# Patient Record
Sex: Female | Born: 1945 | Race: White | Hispanic: No | Marital: Married | Smoking: Never smoker
Health system: Southern US, Community
[De-identification: ages and names within clinical notes are randomized; demographics above are authoritative.]

## PROBLEM LIST (undated history)

## (undated) DIAGNOSIS — O00109 Unspecified tubal pregnancy without intrauterine pregnancy: Secondary | ICD-10-CM

## (undated) HISTORY — DX: Unspecified tubal pregnancy without intrauterine pregnancy: O00.109

## (undated) HISTORY — PX: ABDOMINAL HYSTERECTOMY: SHX81

## (undated) HISTORY — PX: BREAST LUMPECTOMY: SHX2

---

## 2007-06-12 ENCOUNTER — Ambulatory Visit: Payer: Self-pay | Admitting: Internal Medicine

## 2007-07-10 ENCOUNTER — Ambulatory Visit: Payer: Self-pay | Admitting: Internal Medicine

## 2015-08-17 ENCOUNTER — Other Ambulatory Visit (HOSPITAL_BASED_OUTPATIENT_CLINIC_OR_DEPARTMENT_OTHER): Payer: Self-pay | Admitting: Internal Medicine

## 2015-08-17 DIAGNOSIS — Z1231 Encounter for screening mammogram for malignant neoplasm of breast: Secondary | ICD-10-CM

## 2015-09-04 ENCOUNTER — Ambulatory Visit (HOSPITAL_BASED_OUTPATIENT_CLINIC_OR_DEPARTMENT_OTHER)
Admission: RE | Admit: 2015-09-04 | Discharge: 2015-09-04 | Disposition: A | Discharge status: Home / Self Care | Payer: Medicare Other | Source: Ambulatory Visit | Attending: Internal Medicine | Admitting: Internal Medicine

## 2015-09-04 DIAGNOSIS — Z1231 Encounter for screening mammogram for malignant neoplasm of breast: Secondary | ICD-10-CM | POA: Diagnosis not present

## 2016-07-31 ENCOUNTER — Other Ambulatory Visit (HOSPITAL_BASED_OUTPATIENT_CLINIC_OR_DEPARTMENT_OTHER): Payer: Self-pay | Admitting: Internal Medicine

## 2016-07-31 DIAGNOSIS — Z1231 Encounter for screening mammogram for malignant neoplasm of breast: Secondary | ICD-10-CM

## 2016-09-05 ENCOUNTER — Ambulatory Visit (HOSPITAL_BASED_OUTPATIENT_CLINIC_OR_DEPARTMENT_OTHER)
Admission: RE | Admit: 2016-09-05 | Discharge: 2016-09-05 | Disposition: A | Discharge status: Home / Self Care | Payer: Medicare Other | Source: Ambulatory Visit | Attending: Internal Medicine | Admitting: Internal Medicine

## 2016-09-05 DIAGNOSIS — Z1231 Encounter for screening mammogram for malignant neoplasm of breast: Secondary | ICD-10-CM | POA: Diagnosis present

## 2017-08-04 ENCOUNTER — Encounter: Payer: Self-pay | Admitting: Gastroenterology

## 2017-08-06 ENCOUNTER — Other Ambulatory Visit (HOSPITAL_BASED_OUTPATIENT_CLINIC_OR_DEPARTMENT_OTHER): Payer: Self-pay | Admitting: Internal Medicine

## 2017-08-06 DIAGNOSIS — Z1231 Encounter for screening mammogram for malignant neoplasm of breast: Secondary | ICD-10-CM

## 2017-09-15 ENCOUNTER — Inpatient Hospital Stay (HOSPITAL_BASED_OUTPATIENT_CLINIC_OR_DEPARTMENT_OTHER): Admission: RE | Admit: 2017-09-15 | Payer: Medicare Other | Source: Ambulatory Visit

## 2017-10-03 ENCOUNTER — Ambulatory Visit (HOSPITAL_BASED_OUTPATIENT_CLINIC_OR_DEPARTMENT_OTHER)
Admission: RE | Admit: 2017-10-03 | Discharge: 2017-10-03 | Disposition: A | Discharge status: Home / Self Care | Payer: Medicare Other | Source: Ambulatory Visit | Attending: Internal Medicine | Admitting: Internal Medicine

## 2017-10-03 DIAGNOSIS — Z1231 Encounter for screening mammogram for malignant neoplasm of breast: Secondary | ICD-10-CM | POA: Insufficient documentation

## 2018-09-18 ENCOUNTER — Other Ambulatory Visit (HOSPITAL_BASED_OUTPATIENT_CLINIC_OR_DEPARTMENT_OTHER): Payer: Self-pay | Admitting: Physician Assistant

## 2018-09-18 DIAGNOSIS — Z1231 Encounter for screening mammogram for malignant neoplasm of breast: Secondary | ICD-10-CM

## 2018-10-09 ENCOUNTER — Ambulatory Visit (HOSPITAL_BASED_OUTPATIENT_CLINIC_OR_DEPARTMENT_OTHER)
Admission: RE | Admit: 2018-10-09 | Discharge: 2018-10-09 | Disposition: A | Discharge status: Home / Self Care | Payer: Medicare Other | Source: Ambulatory Visit | Attending: Physician Assistant | Admitting: Physician Assistant

## 2018-10-09 DIAGNOSIS — Z1231 Encounter for screening mammogram for malignant neoplasm of breast: Secondary | ICD-10-CM | POA: Insufficient documentation

## 2019-09-07 ENCOUNTER — Other Ambulatory Visit (HOSPITAL_BASED_OUTPATIENT_CLINIC_OR_DEPARTMENT_OTHER): Payer: Self-pay | Admitting: Internal Medicine

## 2019-09-07 DIAGNOSIS — Z1231 Encounter for screening mammogram for malignant neoplasm of breast: Secondary | ICD-10-CM

## 2019-10-12 ENCOUNTER — Encounter (HOSPITAL_BASED_OUTPATIENT_CLINIC_OR_DEPARTMENT_OTHER): Payer: Self-pay

## 2019-10-12 ENCOUNTER — Other Ambulatory Visit (HOSPITAL_BASED_OUTPATIENT_CLINIC_OR_DEPARTMENT_OTHER): Payer: Self-pay | Admitting: Physician Assistant

## 2019-10-12 ENCOUNTER — Other Ambulatory Visit (HOSPITAL_BASED_OUTPATIENT_CLINIC_OR_DEPARTMENT_OTHER): Payer: Self-pay | Admitting: Obstetrics and Gynecology

## 2019-10-12 ENCOUNTER — Ambulatory Visit (HOSPITAL_BASED_OUTPATIENT_CLINIC_OR_DEPARTMENT_OTHER)
Admission: RE | Admit: 2019-10-12 | Discharge: 2019-10-12 | Disposition: A | Discharge status: Home / Self Care | Payer: Medicare PPO | Source: Ambulatory Visit | Attending: Internal Medicine | Admitting: Internal Medicine

## 2019-10-12 ENCOUNTER — Other Ambulatory Visit: Payer: Self-pay

## 2019-10-12 DIAGNOSIS — Z1231 Encounter for screening mammogram for malignant neoplasm of breast: Secondary | ICD-10-CM

## 2019-10-13 ENCOUNTER — Other Ambulatory Visit: Payer: Self-pay | Admitting: Physician Assistant

## 2019-10-13 DIAGNOSIS — R928 Other abnormal and inconclusive findings on diagnostic imaging of breast: Secondary | ICD-10-CM

## 2019-10-19 ENCOUNTER — Other Ambulatory Visit: Payer: Self-pay

## 2019-10-19 ENCOUNTER — Ambulatory Visit
Admission: RE | Admit: 2019-10-19 | Discharge: 2019-10-19 | Disposition: A | Discharge status: Home / Self Care | Payer: Medicare PPO | Source: Ambulatory Visit | Attending: Physician Assistant | Admitting: Physician Assistant

## 2019-10-19 DIAGNOSIS — R928 Other abnormal and inconclusive findings on diagnostic imaging of breast: Secondary | ICD-10-CM

## 2019-12-02 ENCOUNTER — Ambulatory Visit: Payer: Medicare PPO

## 2020-09-21 ENCOUNTER — Other Ambulatory Visit: Payer: Self-pay | Admitting: Physician Assistant

## 2020-09-21 DIAGNOSIS — Z0000 Encounter for general adult medical examination without abnormal findings: Secondary | ICD-10-CM

## 2020-11-01 ENCOUNTER — Other Ambulatory Visit: Payer: Self-pay

## 2020-11-01 ENCOUNTER — Ambulatory Visit
Admission: RE | Admit: 2020-11-01 | Discharge: 2020-11-01 | Disposition: A | Discharge status: Home / Self Care | Payer: Medicare PPO | Source: Ambulatory Visit | Attending: Physician Assistant | Admitting: Physician Assistant

## 2020-11-01 DIAGNOSIS — Z0000 Encounter for general adult medical examination without abnormal findings: Secondary | ICD-10-CM

## 2021-10-09 ENCOUNTER — Other Ambulatory Visit: Payer: Self-pay | Admitting: Physician Assistant

## 2021-10-09 DIAGNOSIS — Z1231 Encounter for screening mammogram for malignant neoplasm of breast: Secondary | ICD-10-CM

## 2021-11-08 ENCOUNTER — Ambulatory Visit: Payer: Medicare PPO

## 2021-11-28 ENCOUNTER — Ambulatory Visit
Admission: RE | Admit: 2021-11-28 | Discharge: 2021-11-28 | Disposition: A | Discharge status: Home / Self Care | Payer: Medicare PPO | Source: Ambulatory Visit | Attending: Physician Assistant | Admitting: Physician Assistant

## 2021-11-28 DIAGNOSIS — Z1231 Encounter for screening mammogram for malignant neoplasm of breast: Secondary | ICD-10-CM

## 2022-06-13 IMAGING — MG MM DIGITAL SCREENING BILAT W/ TOMO AND CAD
6 of 10 series · 6 of 30 positions shown · non-contrast
Comparison: Previous exam(s).

CLINICAL DATA: Screening.

EXAM:
DIGITAL SCREENING BILATERAL MAMMOGRAM WITH TOMO AND CAD

[L CC synth-2D]
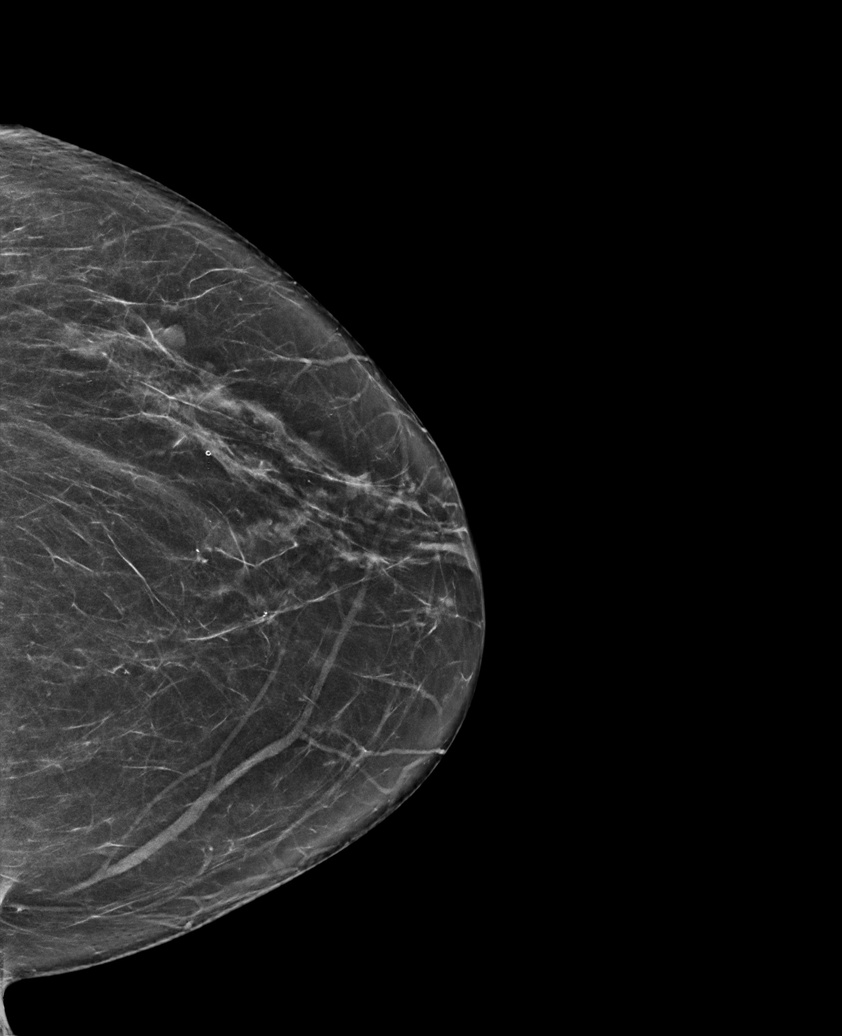

[R CC synth-2D]
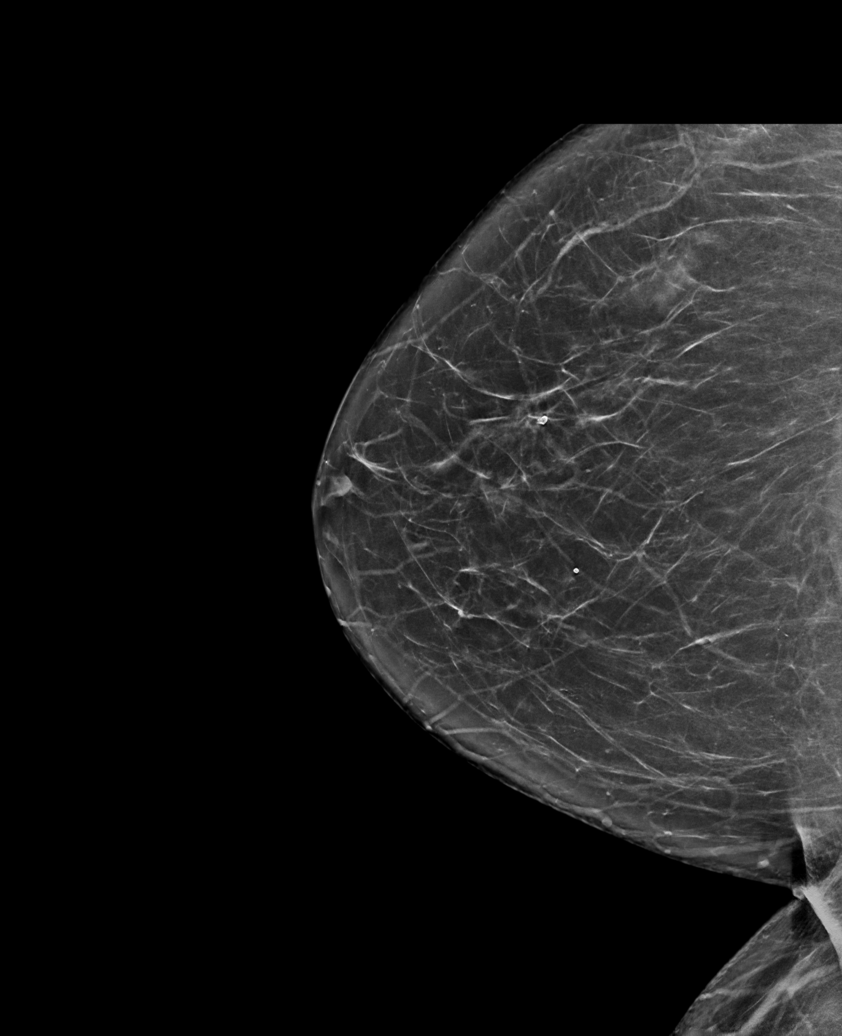

[L MLO synth-2D (1 of 2)]
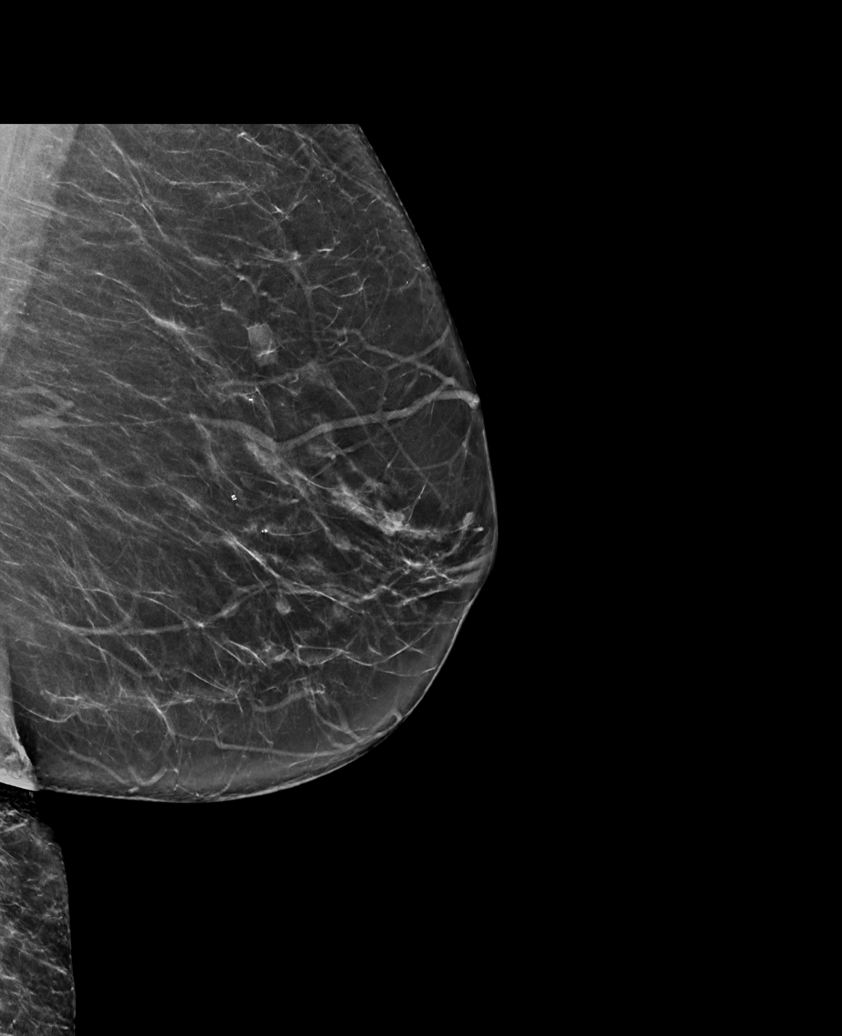

[R MLO synth-2D]
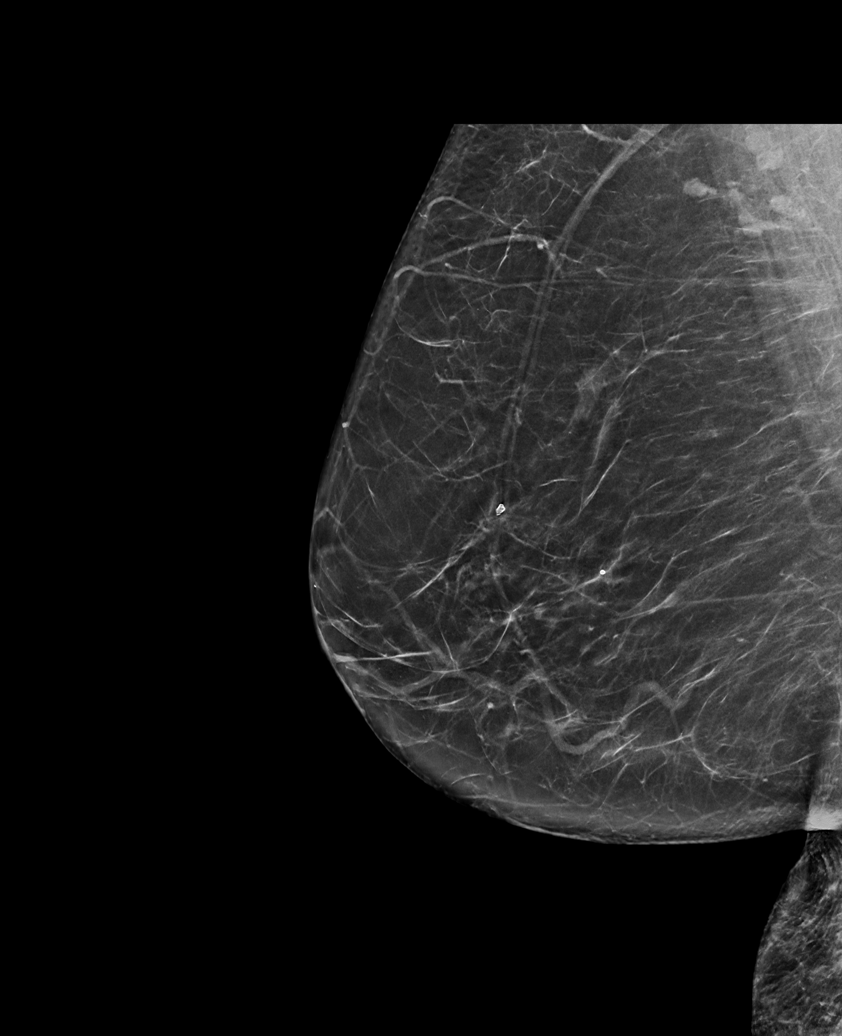

[L MLO synth-2D (2 of 2)]
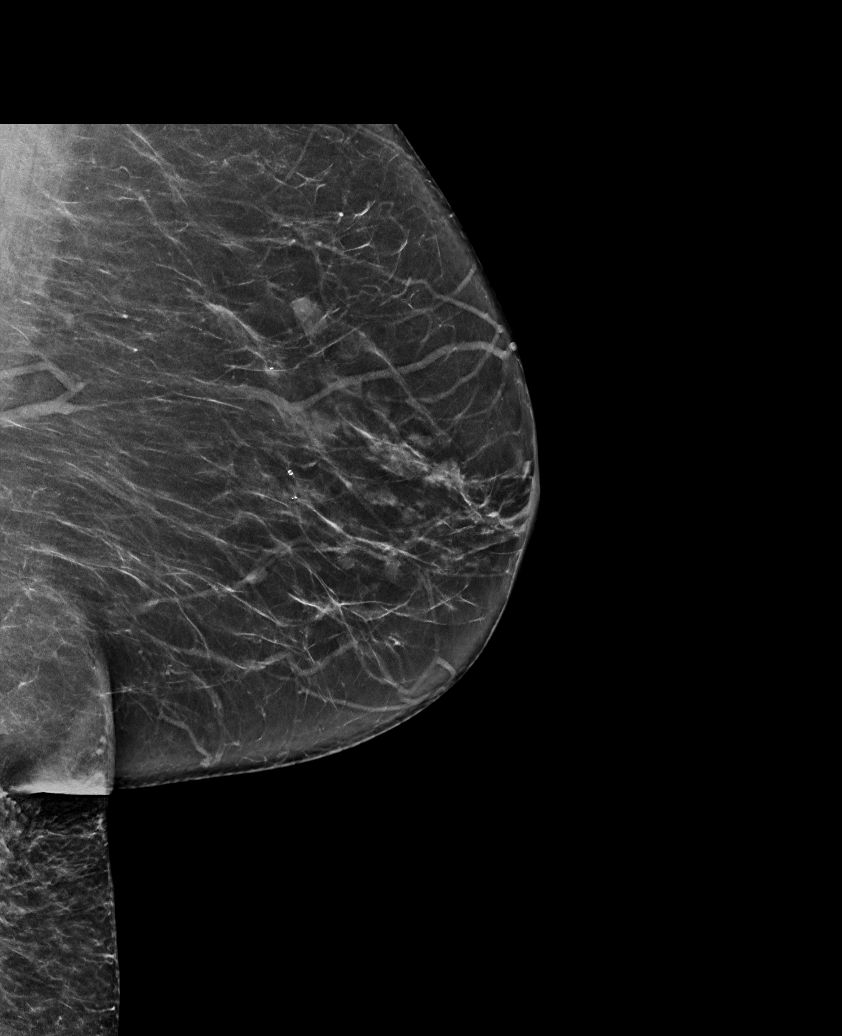

[L MLO tomo · tomo slice 37/73.0]
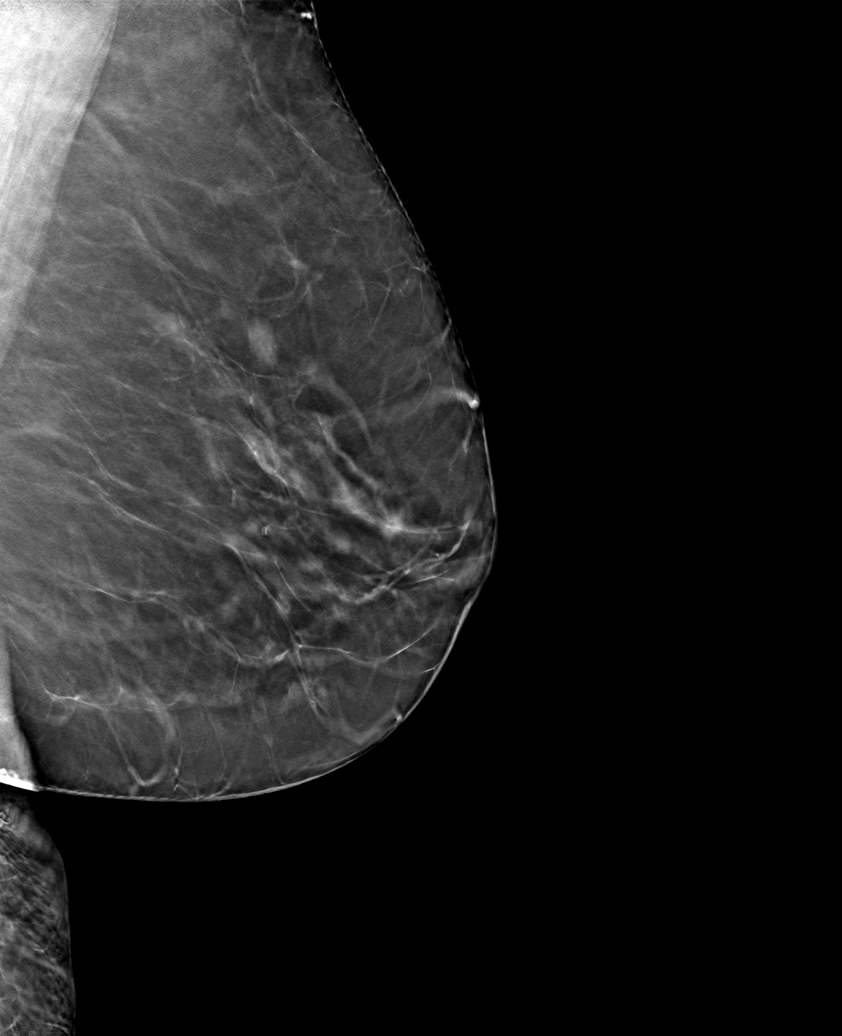

[6 of 30 positions shown; findings below may reference images not displayed]

ACR Breast Density Category b: There are scattered areas of
fibroglandular density.
FINDINGS: There are no findings suspicious for malignancy. The images were
evaluated with computer-aided detection.
IMPRESSION: No mammographic evidence of malignancy. A result letter of this
screening mammogram will be mailed directly to the patient.

RECOMMENDATION:
Screening mammogram in one year. (Code:ZP-7-VX7)

BI-RADS CATEGORY  1: Negative.

## 2022-09-27 ENCOUNTER — Encounter (HOSPITAL_BASED_OUTPATIENT_CLINIC_OR_DEPARTMENT_OTHER): Payer: Self-pay | Admitting: Emergency Medicine

## 2022-09-27 ENCOUNTER — Emergency Department (HOSPITAL_BASED_OUTPATIENT_CLINIC_OR_DEPARTMENT_OTHER)
Admission: EM | Admit: 2022-09-27 | Discharge: 2022-09-27 | Disposition: A | Discharge status: Home / Self Care | Payer: Medicare PPO | Attending: Emergency Medicine | Admitting: Emergency Medicine

## 2022-09-27 ENCOUNTER — Other Ambulatory Visit: Payer: Self-pay

## 2022-09-27 DIAGNOSIS — R300 Dysuria: Secondary | ICD-10-CM | POA: Diagnosis present

## 2022-09-27 DIAGNOSIS — R8271 Bacteriuria: Secondary | ICD-10-CM | POA: Insufficient documentation

## 2022-09-27 LAB — URINALYSIS, ROUTINE W REFLEX MICROSCOPIC
Bilirubin Urine: NEGATIVE
Glucose, UA: 100 mg/dL — AB
Hgb urine dipstick: NEGATIVE
Ketones, ur: NEGATIVE mg/dL
Leukocytes,Ua: NEGATIVE
Nitrite: POSITIVE — AB
Protein, ur: NEGATIVE mg/dL
Specific Gravity, Urine: 1.015 (ref 1.005–1.030)
pH: 5.5 (ref 5.0–8.0)

## 2022-09-27 LAB — URINALYSIS, MICROSCOPIC (REFLEX): WBC, UA: NONE SEEN WBC/hpf (ref 0–5)

## 2022-09-27 MED ORDER — CEFDINIR 300 MG PO CAPS: 300.0000 mg | ORAL_CAPSULE | Freq: Two times a day (BID) | ORAL | 0 refills | Status: AC

## 2022-09-27 MED ORDER — CEFDINIR 300 MG PO CAPS
300.0000 mg | ORAL_CAPSULE | Freq: Once | ORAL | Status: AC
  Administered 2022-09-27: 300 mg via ORAL
  Filled 2022-09-27: qty 1

## 2022-09-27 NOTE — ED Triage Notes (Signed)
Pt reports seeing PCP for malodorous urine, was tx for UTI with Augmentin which she finished Tuesday. She reports urgency, and dysuria since Wednesday night. Pt has hx of ESBL. Denies fever, vomiting, or flank pain. Reports some intermittent nausea.

## 2022-09-27 NOTE — ED Provider Notes (Signed)
[facility] EMERGENCY DEPARTMENT Provider Note   CSN: [id number] Arrival date & time: 09/27/22  1728     History Chief Complaint  Patient presents with   Dysuria    HPI Leslie Anderson is a 76 y.o. female presenting for dysuria and bacteriuria.  She states that she is a 76 year old female who has a history of significant urinary tract infections.  She states that she finished a 5-day course of Augmentin on Monday and unfortunately her symptoms have been recurrent.  She denies fevers or chills, nausea vomiting, syncope shortness of breath.  She otherwise ambulatory tolerating p.o. intake.  She states that she has needed multiple antibiotic courses for past infections as well..   Patient's recorded medical, surgical, social, medication list and allergies were reviewed in the Snapshot window as part of the initial history.   Review of Systems   Review of Systems  Constitutional:  Negative for chills and fever.  HENT:  Negative for ear pain and sore throat.   Eyes:  Negative for pain and visual disturbance.  Respiratory:  Negative for cough and shortness of breath.   Cardiovascular:  Negative for chest pain and palpitations.  Gastrointestinal:  Negative for abdominal pain and vomiting.  Genitourinary:  Positive for dysuria. Negative for hematuria.  Musculoskeletal:  Negative for arthralgias and back pain.  Skin:  Negative for color change and rash.  Neurological:  Negative for seizures and syncope.  All other systems reviewed and are negative.   Physical Exam Updated Vital Signs BP (!) 163/94   Pulse 90   Temp 98.1 F (36.7 C) (Oral)   Resp 18   Ht 5\' 4"  (1.626 m)   Wt 83.9 kg   SpO2 100%   BMI 31.76 kg/m  Physical Exam Vitals and nursing note reviewed.  Constitutional:      General: She is not in acute distress.    Appearance: She is well-developed.  HENT:     Head: Normocephalic and atraumatic.  Eyes:     Conjunctiva/sclera: Conjunctivae normal.   Cardiovascular:     Rate and Rhythm: Normal rate and regular rhythm.     Heart sounds: No murmur heard. Pulmonary:     Effort: Pulmonary effort is normal. No respiratory distress.     Breath sounds: Normal breath sounds.  Abdominal:     General: There is no distension.     Palpations: Abdomen is soft.     Tenderness: There is no abdominal tenderness. There is no right CVA tenderness or left CVA tenderness.  Musculoskeletal:        General: No swelling or tenderness. Normal range of motion.     Cervical back: Neck supple.  Skin:    General: Skin is warm and dry.  Neurological:     General: No focal deficit present.     Mental Status: She is alert and oriented to person, place, and time. Mental status is at baseline.     Cranial Nerves: No cranial nerve deficit.      ED Course/ Medical Decision Making/ A&P    Procedures Procedures   Medications Ordered in ED Medications  cefdinir (OMNICEF) capsule 300 mg (300 mg Oral Given 09/27/22 2034)    Medical Decision Making:    Leslie Anderson is a 76 y.o. female who presented to the ED today with recurrent dysuria detailed above.     Patient's presentation is complicated by their history of ESBL.  Patient placed on continuous vitals and telemetry monitoring while in  ED which was reviewed periodically.   Complete initial physical exam performed, notably the patient  was hemodynamically stable in no acute distress.  No CVA tenderness overall well-appearing hemodynamically stable.      Reviewed and confirmed nursing documentation for past medical history, family history, social history.    Initial Assessment:   Patient's history of present illness and physical exam findings are most consistent with urinary tract infection. No evidence of pyelonephritis, no evidence of nephrolithiasis at this time.  Will perform urinalysis to further speciate.  Demonstrates bacteriuria and consistent nitrates.  Given Augmentin failure, will  broaden to third-generation cephalosporin.  I recommended initial dosing via IV with ceftriaxone and patient refused.  She states she just wants a new prescription and she would plan to follow-up in the outpatient setting.  Given initial treatment failure, will broaden her to 10-day course.  I discussed supportive care to ensure sure symptom mitigation regarding GI pathogens and patient expressed understanding.  Patient stable for outpatient care and management at this time with no acute indication for further intervention.  Disposition:  I have considered need for hospitalization, however, considering all of the above, I believe this patient is stable for discharge at this time.  Patient/family educated about specific return precautions for given chief complaint and symptoms.  Patient/family educated about follow-up with PCP.     Patient/family expressed understanding of return precautions and need for follow-up. Patient spoken to regarding all imaging and laboratory results and appropriate follow up for these results. All education provided in verbal form with additional information in written form. Time was allowed for answering of patient questions. Patient discharged.    Emergency Department Medication Summary:   Medications  cefdinir (OMNICEF) capsule 300 mg (300 mg Oral Given 09/27/22 2034)        Clinical Impression:  1. Dysuria      Discharge   Final Clinical Impression(s) / ED Diagnoses Final diagnoses:  Dysuria    Rx / DC Orders ED Discharge Orders          Ordered    cefdinir (OMNICEF) 300 MG capsule  2 times daily        09/27/22 2027              Glyn Ade, MD 09/27/22 2306

## 2022-10-18 ENCOUNTER — Other Ambulatory Visit: Payer: Self-pay | Admitting: Physician Assistant

## 2022-10-18 DIAGNOSIS — Z1231 Encounter for screening mammogram for malignant neoplasm of breast: Secondary | ICD-10-CM

## 2022-12-05 ENCOUNTER — Ambulatory Visit
Admission: RE | Admit: 2022-12-05 | Discharge: 2022-12-05 | Disposition: A | Discharge status: Home / Self Care | Payer: Medicare PPO | Source: Ambulatory Visit | Attending: Physician Assistant | Admitting: Physician Assistant

## 2022-12-05 DIAGNOSIS — Z1231 Encounter for screening mammogram for malignant neoplasm of breast: Secondary | ICD-10-CM

## 2023-10-29 ENCOUNTER — Other Ambulatory Visit: Payer: Self-pay | Admitting: Physician Assistant

## 2023-10-29 DIAGNOSIS — Z1231 Encounter for screening mammogram for malignant neoplasm of breast: Secondary | ICD-10-CM

## 2023-12-08 ENCOUNTER — Ambulatory Visit
Admission: RE | Admit: 2023-12-08 | Discharge: 2023-12-08 | Disposition: A | Discharge status: Home / Self Care | Payer: Medicare PPO | Source: Ambulatory Visit | Attending: Physician Assistant | Admitting: Physician Assistant

## 2023-12-08 DIAGNOSIS — Z1231 Encounter for screening mammogram for malignant neoplasm of breast: Secondary | ICD-10-CM
